# Patient Record
Sex: Male | Born: 1970 | Race: White | Hispanic: No | Marital: Single
Health system: Southern US, Community
[De-identification: ages and names within clinical notes are randomized; demographics above are authoritative.]

---

## 2009-08-27 ENCOUNTER — Emergency Department: Payer: Self-pay | Admitting: Emergency Medicine

## 2019-10-10 ENCOUNTER — Emergency Department (HOSPITAL_COMMUNITY): Payer: Self-pay

## 2019-10-10 ENCOUNTER — Other Ambulatory Visit: Payer: Self-pay

## 2019-10-10 ENCOUNTER — Emergency Department (HOSPITAL_COMMUNITY)
Admission: EM | Admit: 2019-10-10 | Discharge: 2019-10-10 | Disposition: A | Discharge status: Home / Self Care | Payer: Self-pay | Attending: Emergency Medicine | Admitting: Emergency Medicine

## 2019-10-10 DIAGNOSIS — I714 Abdominal aortic aneurysm, without rupture, unspecified: Secondary | ICD-10-CM

## 2019-10-10 DIAGNOSIS — S2239XA Fracture of one rib, unspecified side, initial encounter for closed fracture: Secondary | ICD-10-CM | POA: Insufficient documentation

## 2019-10-10 DIAGNOSIS — S060X0A Concussion without loss of consciousness, initial encounter: Secondary | ICD-10-CM | POA: Insufficient documentation

## 2019-10-10 DIAGNOSIS — F10929 Alcohol use, unspecified with intoxication, unspecified: Secondary | ICD-10-CM | POA: Insufficient documentation

## 2019-10-10 DIAGNOSIS — Y92410 Unspecified street and highway as the place of occurrence of the external cause: Secondary | ICD-10-CM | POA: Insufficient documentation

## 2019-10-10 DIAGNOSIS — R079 Chest pain, unspecified: Secondary | ICD-10-CM

## 2019-10-10 DIAGNOSIS — R109 Unspecified abdominal pain: Secondary | ICD-10-CM

## 2019-10-10 DIAGNOSIS — Y999 Unspecified external cause status: Secondary | ICD-10-CM | POA: Insufficient documentation

## 2019-10-10 DIAGNOSIS — Y908 Blood alcohol level of 240 mg/100 ml or more: Secondary | ICD-10-CM | POA: Insufficient documentation

## 2019-10-10 DIAGNOSIS — Y93I9 Activity, other involving external motion: Secondary | ICD-10-CM | POA: Insufficient documentation

## 2019-10-10 LAB — CBC WITH DIFFERENTIAL/PLATELET
Abs Immature Granulocytes: 0.02 10*3/uL (ref 0.00–0.07)
Basophils Absolute: 0.1 10*3/uL (ref 0.0–0.1)
Basophils Relative: 1 %
Eosinophils Absolute: 0.1 10*3/uL (ref 0.0–0.5)
Eosinophils Relative: 2 %
HCT: 41.2 % (ref 39.0–52.0)
Hemoglobin: 13.6 g/dL (ref 13.0–17.0)
Immature Granulocytes: 0 %
Lymphocytes Relative: 32 %
Lymphs Abs: 2.4 10*3/uL (ref 0.7–4.0)
MCH: 33.1 pg (ref 26.0–34.0)
MCHC: 33 g/dL (ref 30.0–36.0)
MCV: 100.2 fL — ABNORMAL HIGH (ref 80.0–100.0)
Monocytes Absolute: 0.6 10*3/uL (ref 0.1–1.0)
Monocytes Relative: 9 %
Neutro Abs: 4.2 10*3/uL (ref 1.7–7.7)
Neutrophils Relative %: 56 %
Platelets: 356 10*3/uL (ref 150–400)
RBC: 4.11 MIL/uL — ABNORMAL LOW (ref 4.22–5.81)
RDW: 12.1 % (ref 11.5–15.5)
WBC: 7.4 10*3/uL (ref 4.0–10.5)
nRBC: 0 % (ref 0.0–0.2)

## 2019-10-10 LAB — COMPREHENSIVE METABOLIC PANEL
ALT: 28 U/L (ref 0–44)
AST: 36 U/L (ref 15–41)
Albumin: 3.6 g/dL (ref 3.5–5.0)
Alkaline Phosphatase: 67 U/L (ref 38–126)
Anion gap: 16 — ABNORMAL HIGH (ref 5–15)
BUN: 15 mg/dL (ref 6–20)
CO2: 18 mmol/L — ABNORMAL LOW (ref 22–32)
Calcium: 8.9 mg/dL (ref 8.9–10.3)
Chloride: 110 mmol/L (ref 98–111)
Creatinine, Ser: 0.91 mg/dL (ref 0.61–1.24)
GFR calc Af Amer: >60 mL/min (ref >60)
GFR calc non Af Amer: >60 mL/min (ref >60)
Glucose, Bld: 80 mg/dL (ref 70–99)
Potassium: 4.5 mmol/L (ref 3.5–5.1)
Sodium: 144 mmol/L (ref 135–145)
Total Bilirubin: 0.7 mg/dL (ref 0.3–1.2)
Total Protein: 6.1 g/dL — ABNORMAL LOW (ref 6.5–8.1)

## 2019-10-10 LAB — I-STAT CHEM 8, ED
BUN: 20 mg/dL (ref 6–20)
Calcium, Ion: 1.04 mmol/L — ABNORMAL LOW (ref 1.15–1.40)
Chloride: 113 mmol/L — ABNORMAL HIGH (ref 98–111)
Creatinine, Ser: 1.1 mg/dL (ref 0.61–1.24)
Glucose, Bld: 79 mg/dL (ref 70–99)
HCT: 40 % (ref 39.0–52.0)
Hemoglobin: 13.6 g/dL (ref 13.0–17.0)
Potassium: 4.4 mmol/L (ref 3.5–5.1)
Sodium: 147 mmol/L — ABNORMAL HIGH (ref 135–145)
TCO2: 23 mmol/L (ref 22–32)

## 2019-10-10 LAB — LACTIC ACID, PLASMA: Lactic Acid, Venous: 3.4 mmol/L (ref 0.5–1.9)

## 2019-10-10 LAB — ETHANOL: Alcohol, Ethyl (B): 259 mg/dL — ABNORMAL HIGH (ref <10)

## 2019-10-10 MED ORDER — IOHEXOL 300 MG/ML  SOLN
100.0000 mL | Freq: Once | INTRAMUSCULAR | Status: AC | PRN
  Administered 2019-10-10: 100 mL via INTRAVENOUS

## 2019-10-10 MED ORDER — KETOROLAC TROMETHAMINE 30 MG/ML IJ SOLN
30.0000 mg | Freq: Once | INTRAMUSCULAR | Status: AC
  Administered 2019-10-10: 30 mg via INTRAVENOUS
  Filled 2019-10-10: qty 1

## 2019-10-10 MED ORDER — SODIUM CHLORIDE 0.9 % IV BOLUS
1000.0000 mL | Freq: Once | INTRAVENOUS | Status: AC
  Administered 2019-10-10: 1000 mL via INTRAVENOUS

## 2019-10-10 NOTE — ED Notes (Signed)
Pt verbalized understanding of discharge instructions. Follow up care reviewed, pt had no further questions. 
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
## 2019-10-10 NOTE — Progress Notes (Signed)
   10/10/19 1800  °Clinical Encounter Type  °Visited With Patient not available  °Visit Type ED;Trauma  °Referral From Nurse  °Consult/Referral To Chaplain  ° °Chaplain responded to trauma page, support was not needed at this time. The chaplain is available if requested. ° °Kyree Fedorko °Chaplain Resident °For questions concerning this note please contact me by pager [phone] °
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
## 2019-10-10 NOTE — Discharge Instructions (Addendum)
Your work-up today was overall reassuring.  Your CT scan did note that you had an abdominal aortic aneurysm, it is recommended that you have this reevaluated every year with repeat scans.  Please follow-up with your primary care doctor for this.  If you do not have a primary care doctor, please call the phone number on your discharge paperwork in order to establish with 1.  If you cannot afford a primary care doctor, please call [facility] whose contact information I provided.  It is very important for you to make sure to continue to have this reevaluated.  Otherwise, your scans did not show any significant injuries.  You may experience aches and pains.  You also may have a concussion given significant head injury.  Please make sure to have your self monitored for the next 24 hours, return to the ER if you have worsening sleepiness or confusion.  Your CT scan showed a possible rib fracture. You were given a incentive spirometer, please breathe into it every 20-30 mins for at least a week and until your pain becomes more manageable. Return to the ER for worsening pain
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
